# Patient Record
Sex: Male | Born: 1965
Health system: Southern US, Community
[De-identification: ages and names within clinical notes are randomized; demographics above are authoritative.]

---

## 2016-06-19 ENCOUNTER — Encounter (HOSPITAL_COMMUNITY): Payer: Self-pay | Admitting: Emergency Medicine

## 2016-06-19 ENCOUNTER — Emergency Department (HOSPITAL_COMMUNITY)
Admission: EM | Admit: 2016-06-19 | Discharge: 2016-06-19 | Disposition: A | Discharge status: Home / Self Care | Payer: BLUE CROSS/BLUE SHIELD | Attending: Emergency Medicine | Admitting: Emergency Medicine

## 2016-06-19 ENCOUNTER — Emergency Department (HOSPITAL_COMMUNITY): Payer: BLUE CROSS/BLUE SHIELD

## 2016-06-19 DIAGNOSIS — R002 Palpitations: Secondary | ICD-10-CM | POA: Insufficient documentation

## 2016-06-19 LAB — COMPREHENSIVE METABOLIC PANEL
ALBUMIN: 3.7 g/dL (ref 3.5–5.0)
ALK PHOS: 46 U/L (ref 38–126)
ALT: 16 U/L — ABNORMAL LOW (ref 17–63)
ANION GAP: 8 (ref 5–15)
AST: 19 U/L (ref 15–41)
BILIRUBIN TOTAL: 0.7 mg/dL (ref 0.3–1.2)
BUN: 21 mg/dL — AB (ref 6–20)
CALCIUM: 9.3 mg/dL (ref 8.9–10.3)
CO2: 24 mmol/L (ref 22–32)
Chloride: 106 mmol/L (ref 101–111)
Creatinine, Ser: 1.39 mg/dL — ABNORMAL HIGH (ref 0.61–1.24)
GFR calc Af Amer: >60 mL/min (ref >60)
GFR, EST NON AFRICAN AMERICAN: 57 mL/min — AB (ref >60)
GLUCOSE: 123 mg/dL — AB (ref 65–99)
Potassium: 3.9 mmol/L (ref 3.5–5.1)
Sodium: 138 mmol/L (ref 135–145)
TOTAL PROTEIN: 6.8 g/dL (ref 6.5–8.1)

## 2016-06-19 LAB — I-STAT TROPONIN, ED: TROPONIN I, POC: 0 ng/mL (ref 0.00–0.08)

## 2016-06-19 LAB — I-STAT CHEM 8, ED
BUN: 25 mg/dL — ABNORMAL HIGH (ref 6–20)
CHLORIDE: 105 mmol/L (ref 101–111)
CREATININE: 1.5 mg/dL — AB (ref 0.61–1.24)
Calcium, Ion: 1.1 mmol/L — ABNORMAL LOW (ref 1.15–1.40)
Glucose, Bld: 117 mg/dL — ABNORMAL HIGH (ref 65–99)
HEMATOCRIT: 45 % (ref 39.0–52.0)
HEMOGLOBIN: 15.3 g/dL (ref 13.0–17.0)
POTASSIUM: 3.8 mmol/L (ref 3.5–5.1)
Sodium: 139 mmol/L (ref 135–145)
TCO2: 24 mmol/L (ref 0–100)

## 2016-06-19 LAB — CBC
HEMATOCRIT: 42.1 % (ref 39.0–52.0)
HEMOGLOBIN: 13.5 g/dL (ref 13.0–17.0)
MCH: 26.4 pg (ref 26.0–34.0)
MCHC: 32.1 g/dL (ref 30.0–36.0)
MCV: 82.2 fL (ref 78.0–100.0)
Platelets: 211 10*3/uL (ref 150–400)
RBC: 5.12 MIL/uL (ref 4.22–5.81)
RDW: 14 % (ref 11.5–15.5)
WBC: 8.2 10*3/uL (ref 4.0–10.5)

## 2016-06-19 NOTE — ED Notes (Signed)
Pt states she took a supplement and drank coffee at 2130 yesterday evening. Pt states he felt as though his heart was racing.

## 2016-06-19 NOTE — ED Notes (Signed)
Patient transported to x-ray. ?

## 2016-06-19 NOTE — ED Triage Notes (Signed)
Patient states that he had taken some yohimhe bark and coffee together.  He states that was about 10pm.  He states that he had a feeling of his heart racing, but no chest pain.  No shortness of breath.  EMS came to see him, all vitals at that time were normal.  Patient states that he thought that he should come in.

## 2016-06-19 NOTE — ED Provider Notes (Signed)
[facility]-EMERGENCY DEPT Provider Note   CSN: [id number] Arrival date & time: 06/19/16  0200  By signing my name below, I, Donald Mccarty, attest that this documentation has been prepared under the direction and in the presence of Donald Rhine, MD. Electronically Signed: Rosario Mccarty, ED Scribe. 06/19/16. 3:41 AM.  History   Chief Complaint Chief Complaint  Patient presents with  . Tachycardia   The history is provided by the patient. No language interpreter was used.  Palpitations   This is a new problem. The current episode started 6 to 12 hours ago. The problem occurs rarely. The problem has been resolved. The problem is associated with caffeine (supplements). Pertinent negatives include no fever, no chest pain, no abdominal pain, no nausea, no vomiting, no weakness and no shortness of breath. He has tried nothing for the symptoms. Risk factors include being male. His past medical history does not include anemia, heart disease, hyperthyroidism or valve disorder.    HPI Comments: Donald Mccarty is a 51 y.o. male with no pertinent PMHx, who presents to the Emergency Department complaining of sudden onset, intermittent, resolved episodes of sensation of palpitations which occurred approximately 6 hours ago. Pt states that prior to the onset of his sensation that he had ingested a Yohimmbe bark supplement and coffee. He had ingested the supplement 30 minutes prior to the episodes and the cup of coffee before this. Prior to this episode he was asymptomatic and felt at his baseline. He called EMS following this episode, and upon their arrival he notes that the sensation had mostly subsided and his vital signs were stable. After EMS left, he notes that he had one more mild episode of this prompting him to come into the ED. Pt has taken this supplement in the past without similar symptoms. No illicit drug usage. Pt is a non-smoker. No h/o prior MIs or CVAs. He denies syncope, fever, nausea,  vomiting, chest pain, shortness of breath, abdominal pain, weakness, or any other associated symptoms.   PMH - none Soc hx - denies drug or etoh use Home Medications    Prior to Admission medications   Not on File   Family History History reviewed. No pertinent family history.  Social History Social History  Substance Use Topics  . Smoking status: Never Smoker  . Smokeless tobacco: Never Used  . Alcohol use No   Allergies   Patient has no known allergies.  Review of Systems Review of Systems  Constitutional: Negative for fever.  Respiratory: Negative for shortness of breath.   Cardiovascular: Positive for palpitations (resolved). Negative for chest pain.  Gastrointestinal: Negative for abdominal pain, nausea and vomiting.  Neurological: Negative for syncope and weakness.  All other systems reviewed and are negative.  Physical Exam Updated Vital Signs BP 140/93   Pulse 61   Temp 99.2 F (37.3 C) (Oral)   Resp 15   SpO2 99%   Physical Exam CONSTITUTIONAL: Well developed/well nourished HEAD: Normocephalic/atraumatic EYES: EOMI/PERRL ENMT: Mucous membranes moist NECK: supple no meningeal signs SPINE/BACK:entire spine nontender CV: S1/S2 noted, no murmurs/rubs/gallops noted LUNGS: Lungs are clear to auscultation bilaterally, no apparent distress ABDOMEN: soft, nontender, no rebound or guarding, bowel sounds noted throughout abdomen GU:no cva tenderness NEURO: Pt is awake/alert/appropriate, moves all extremitiesx4.  No facial droop.   EXTREMITIES: pulses normal/equal, full ROM SKIN: warm, color normal PSYCH: no abnormalities of mood noted, alert and oriented to situation  ED Treatments / Results  DIAGNOSTIC STUDIES: Oxygen Saturation is 99% on  RA, normal by my interpretation.   COORDINATION OF CARE: 3:40 AM-Discussed next steps with pt. Pt verbalized understanding and is agreeable with the plan.   Labs (all labs ordered are listed, but only abnormal results  are displayed) Labs Reviewed  COMPREHENSIVE METABOLIC PANEL - Abnormal; Notable for the following:       Result Value   Glucose, Bld 123 (*)    BUN 21 (*)    Creatinine, Ser 1.39 (*)    ALT 16 (*)    GFR calc non Af Amer 57 (*)    All other components within normal limits  I-STAT CHEM 8, ED - Abnormal; Notable for the following:    BUN 25 (*)    Creatinine, Ser 1.50 (*)    Glucose, Bld 117 (*)    Calcium, Ion 1.10 (*)    All other components within normal limits  CBC  I-STAT TROPOININ, ED   EKG  EKG Interpretation  Date/Time:  Sunday June 19 2016 02:10:23 EST Ventricular Rate:  65 PR Interval:  122 QRS Duration: 104 QT Interval:  374 QTC Calculation: 388 R Axis:   56 Text Interpretation:  Normal sinus rhythm Nonspecific T wave abnormality Abnormal ECG No previous ECGs available Confirmed by Bebe ShaggyWICKLINE  MD, Jaylnn Ullery ([id number]) on 06/19/2016 3:33:39 AM      Radiology Dg Chest 2 View  Result Date: 06/19/2016 CLINICAL DATA:  Tachycardia.  Nonsmoker. EXAM: CHEST  2 VIEW COMPARISON:  None. FINDINGS: The heart size and mediastinal contours are within normal limits. Both lungs are clear. The visualized skeletal structures are unremarkable. IMPRESSION: No active cardiopulmonary disease. Electronically Signed   By: Burman NievesWilliam  Stevens M.D.   On: 06/19/2016 02:56   Procedures Procedures   Medications Ordered in ED Medications - No data to display  Initial Impression / Assessment and Plan / ED Course  I have reviewed the triage vital signs and the nursing notes.  Pertinent labs & imaging results that were available during my care of the patient were reviewed by me and considered in my medical decision making (see chart for details).     Pt well appearing Pt with isolated palpitations likely due supplement and coffee Now at baseline No abnormal EKG findings Will d/c home We discussed strict ER return precautions I doubt ACS/PE or dysrhythmia at this time  Final Clinical  Impressions(s) / ED Diagnoses   Final diagnoses:  Palpitations   New Prescriptions New Prescriptions   No medications on file   I personally performed the services described in this documentation, which was scribed in my presence. The recorded information has been reviewed and is accurate.       Donald Rhineonald Brees Hounshell, MD 06/19/16 [phone]

## 2016-07-14 ENCOUNTER — Emergency Department (HOSPITAL_COMMUNITY): Payer: BLUE CROSS/BLUE SHIELD

## 2016-07-14 ENCOUNTER — Encounter (HOSPITAL_COMMUNITY): Payer: Self-pay | Admitting: Emergency Medicine

## 2016-07-14 DIAGNOSIS — R0789 Other chest pain: Secondary | ICD-10-CM | POA: Insufficient documentation

## 2016-07-14 DIAGNOSIS — R079 Chest pain, unspecified: Secondary | ICD-10-CM | POA: Diagnosis present

## 2016-07-14 LAB — BASIC METABOLIC PANEL
ANION GAP: 13 (ref 5–15)
BUN: 14 mg/dL (ref 6–20)
CHLORIDE: 94 mmol/L — AB (ref 101–111)
CO2: 27 mmol/L (ref 22–32)
Calcium: 9.5 mg/dL (ref 8.9–10.3)
Creatinine, Ser: 1.29 mg/dL — ABNORMAL HIGH (ref 0.61–1.24)
GFR calc non Af Amer: >60 mL/min (ref >60)
Glucose, Bld: 94 mg/dL (ref 65–99)
POTASSIUM: 3.4 mmol/L — AB (ref 3.5–5.1)
SODIUM: 134 mmol/L — AB (ref 135–145)

## 2016-07-14 LAB — I-STAT TROPONIN, ED: Troponin i, poc: 0.01 ng/mL (ref 0.00–0.08)

## 2016-07-14 LAB — CBC
HEMATOCRIT: 39.1 % (ref 39.0–52.0)
HEMOGLOBIN: 12.6 g/dL — AB (ref 13.0–17.0)
MCH: 26.2 pg (ref 26.0–34.0)
MCHC: 32.2 g/dL (ref 30.0–36.0)
MCV: 81.3 fL (ref 78.0–100.0)
PLATELETS: 219 10*3/uL (ref 150–400)
RBC: 4.81 MIL/uL (ref 4.22–5.81)
RDW: 13.8 % (ref 11.5–15.5)
WBC: 6.3 10*3/uL (ref 4.0–10.5)

## 2016-07-14 NOTE — ED Triage Notes (Signed)
Pt c/o left side chest pressure and dizziness started 1 week ago getting worse today, denies any fever, chills, nausea or vomiting.

## 2016-07-15 ENCOUNTER — Emergency Department (HOSPITAL_COMMUNITY)
Admission: EM | Admit: 2016-07-15 | Discharge: 2016-07-15 | Disposition: A | Discharge status: Home / Self Care | Payer: BLUE CROSS/BLUE SHIELD | Attending: Emergency Medicine | Admitting: Emergency Medicine

## 2016-07-15 DIAGNOSIS — R0789 Other chest pain: Secondary | ICD-10-CM

## 2016-07-15 LAB — I-STAT TROPONIN, ED: TROPONIN I, POC: 0.01 ng/mL (ref 0.00–0.08)

## 2016-07-15 NOTE — ED Provider Notes (Signed)
TIME SEEN: 1:35 AM By signing my name below, I, Donald Mccarty, attest that this documentation has been prepared under the direction and in the presence of Enbridge EnergyKristen N Gaberiel Youngblood, DO.  Electronically Signed: Octavia HeirArianna Mccarty, ED Scribe. 07/15/16. 1:51 AM.  CHIEF COMPLAINT:  Chief Complaint  Patient presents with  . Chest Pain  . Dizziness    HPI:  HPI Comments: Donald Mccarty is a 51 y.o. male who presents to the Emergency Department complaining of moderate, progressively worsening, intermittent, left sided chest pain x 1 week. He states it will have a "warm" sensation that will occasionally "pinch or grab" that feels like a pulled muscle. Pt says his pain will last for a few seconds and will appear randomly. Pt reports associated belching. He says that today he began to feel light-headed if he was going to lose consciousness but he is unsure as to why. Pt expresses he was standing up when he began to feel light-headed at work.  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 He states having URI symptoms Including productive cough and "runny nose" last week but is unsure if that could be a potential cause for his current symptoms. Pt has a brother and mother who both suffered from MI's but is unsure how old they were when they had their first episode. No history of PE, DVT, exogenous estrogen use, recent fractures, surgery, trauma, hospitalization or prolonged travel. No lower extremity swelling or pain. No calf tenderness.  He denies history of hypertension, diabetes or hyperlipidemia. No history of CAD. Pt further denies shortness of breath, nausea, vomiting, sweating. Pt is a non smoker.   ROS: See HPI Constitutional: no fever  Eyes: no drainage  ENT: no runny nose   Cardiovascular:  (+) chest pain  Resp: no SOB  GI: no vomiting GU: no dysuria Integumentary: no rash  Allergy: no hives  Musculoskeletal: no leg swelling  Neurological: no slurred  speech ROS otherwise negative  PAST MEDICAL HISTORY/PAST SURGICAL HISTORY:  History reviewed. No pertinent past medical history.  MEDICATIONS:  Prior to Admission medications   Not on File    ALLERGIES:  No Known Allergies  SOCIAL HISTORY:  Social History  Substance Use Topics  . Smoking status: Never Smoker  . Smokeless tobacco: Never Used  . Alcohol use No    FAMILY HISTORY: History reviewed. No pertinent family history.  EXAM: BP (!) 145/81 (BP Location: Right Arm)   Pulse (!) 51   Temp 98.2 F (36.8 C) (Oral)   Resp 19   Ht 6' (1.829 m)   Wt 175 lb (79.4 kg)   SpO2 100%   BMI 23.73 kg/m  CONSTITUTIONAL: Alert and oriented and responds appropriately to questions. Well-appearing; well-nourished HEAD: Normocephalic EYES: Conjunctivae clear, pupils appear equal, EOMI ENT: normal nose; moist mucous membranes NECK: Supple, no meningismus, no nuchal rigidity, no LAD  CARD: RRR; S1 and S2 appreciated; no murmurs, no clicks, no rubs, no gallops CHEST:  Chest wall is nontender to palpation.  No crepitus, ecchymosis, erythema, warmth, rash or other lesions present.   RESP: Normal chest excursion without splinting or tachypnea; breath sounds clear and equal bilaterally; no wheezes, no rhonchi, no rales, no hypoxia or respiratory distress, speaking full sentences ABD/GI: Normal bowel sounds; non-distended; soft, non-tender, no rebound, no guarding, no peritoneal signs, no hepatosplenomegaly BACK:  The back appears normal and is non-tender to palpation, there is no CVA tenderness EXT: Normal ROM in  all joints; non-tender to palpation; no edema; normal capillary refill; no cyanosis, no calf tenderness or swelling    SKIN: Normal color for age and race; warm; no rash NEURO: Moves all extremities equally PSYCH: The patient's mood and manner are appropriate. Grooming and personal hygiene are appropriate.  MEDICAL DECISION MAKING: Patient here with very atypical chest pain. The  pain lasts for only several seconds and then resolves. States it is worse with pushing, pulling which he does a lot of that work and thinks he may have injured his chest. I suspect this is musculoskeletal pain. He is currently asymptomatic. First troponin is negative. He does have mild elevation of his creatinine which appears to be baseline. Chest x-ray is clear. Plan is to repeat second troponin and if negative discharge home with outpatient follow-up. No risk factors for pulmonary embolus. Doubt dissection.   ED PROGRESS: Patient's second troponin is negative. He is still hemodynamically stable and asymptomatic. He states he does have a primary care physician appointment scheduled on April 12. Have encouraged him to go to this appointment. Discussed with him return precautions. I recommended that he has pain at home he can take Tylenol. He verbalizes understanding and is comfortable with this plan.   At this time, I do not feel there is any life-threatening condition present. I have reviewed and discussed all results (EKG, imaging, lab, urine as appropriate) and exam findings with patient/family. I have reviewed nursing notes and appropriate previous records.  I feel the patient is safe to be discharged home without further emergent workup and can continue workup as an outpatient as needed. Discussed usual and customary return precautions. Patient/family verbalize understanding and are comfortable with this plan.  Outpatient follow-up has been provided if needed. All questions have been answered.     EKG Interpretation  Date/Time:  Thursday July 14 2016 20:19:41 EDT Ventricular Rate:  72 PR Interval:  120 QRS Duration: 108 QT Interval:  388 QTC Calculation: 424 R Axis:   80 Text Interpretation:  Normal sinus rhythm Incomplete right bundle branch block Nonspecific T wave abnormality Abnormal ECG No significant change since last tracing Confirmed by Deni Berti,  DO, Hattie Aguinaldo ([id number]) on 07/15/2016  1:35:27 AM        I personally performed the services described in this documentation, which was scribed in my presence. The recorded information has been reviewed and is accurate.    Layla Maw Zeidy Tayag, DO 07/15/16 [phone]

## 2016-07-15 NOTE — Discharge Instructions (Signed)
You may take Tylenol 1000 mg every 6 hours as needed for pain.   Please follow-up with your primary care physician as scheduled on April 12.

## 2016-08-19 ENCOUNTER — Encounter (HOSPITAL_COMMUNITY): Payer: Self-pay | Admitting: Oncology

## 2016-08-19 ENCOUNTER — Emergency Department (HOSPITAL_COMMUNITY)
Admission: EM | Admit: 2016-08-19 | Discharge: 2016-08-20 | Disposition: A | Discharge status: Home / Self Care | Payer: BLUE CROSS/BLUE SHIELD | Attending: Emergency Medicine | Admitting: Emergency Medicine

## 2016-08-19 DIAGNOSIS — R350 Frequency of micturition: Secondary | ICD-10-CM | POA: Diagnosis not present

## 2016-08-19 LAB — URINALYSIS, ROUTINE W REFLEX MICROSCOPIC
BILIRUBIN URINE: NEGATIVE
Glucose, UA: NEGATIVE mg/dL
HGB URINE DIPSTICK: NEGATIVE
Ketones, ur: NEGATIVE mg/dL
Leukocytes, UA: NEGATIVE
Nitrite: NEGATIVE
PH: 5 (ref 5.0–8.0)
Protein, ur: NEGATIVE mg/dL
SPECIFIC GRAVITY, URINE: 1.002 — AB (ref 1.005–1.030)

## 2016-08-19 NOTE — ED Triage Notes (Signed)
Per pt he was dx by his PCP w/ a UTI on 08/11/16.  Pt given cipro, last dose will be taken on Sunday.  Per pt he has had no improvement in sx.  Reports urinary frequency during the day.  States that he can sleep through the night, in the morning he has no sx however as the day goes on he finds himself needing to urinate approximately every 15 minutes.  Denies burning w/ urination or discharge from penis.

## 2016-08-20 LAB — BASIC METABOLIC PANEL
ANION GAP: 8 (ref 5–15)
BUN: 22 mg/dL — AB (ref 6–20)
CHLORIDE: 101 mmol/L (ref 101–111)
CO2: 27 mmol/L (ref 22–32)
Calcium: 9.6 mg/dL (ref 8.9–10.3)
Creatinine, Ser: 1.05 mg/dL (ref 0.61–1.24)
GFR calc Af Amer: >60 mL/min (ref >60)
GLUCOSE: 85 mg/dL (ref 65–99)
POTASSIUM: 4.1 mmol/L (ref 3.5–5.1)
SODIUM: 136 mmol/L (ref 135–145)

## 2016-08-20 LAB — CBC WITH DIFFERENTIAL/PLATELET
BASOS ABS: 0 10*3/uL (ref 0.0–0.1)
Basophils Relative: 1 %
Eosinophils Absolute: 0.1 10*3/uL (ref 0.0–0.7)
Eosinophils Relative: 2 %
HCT: 40 % (ref 39.0–52.0)
HEMOGLOBIN: 13.1 g/dL (ref 13.0–17.0)
LYMPHS ABS: 1.9 10*3/uL (ref 0.7–4.0)
LYMPHS PCT: 27 %
MCH: 26.4 pg (ref 26.0–34.0)
MCHC: 32.8 g/dL (ref 30.0–36.0)
MCV: 80.5 fL (ref 78.0–100.0)
Monocytes Absolute: 0.4 10*3/uL (ref 0.1–1.0)
Monocytes Relative: 6 %
NEUTROS PCT: 64 %
Neutro Abs: 4.5 10*3/uL (ref 1.7–7.7)
Platelets: 251 10*3/uL (ref 150–400)
RBC: 4.97 MIL/uL (ref 4.22–5.81)
RDW: 15.1 % (ref 11.5–15.5)
WBC: 7 10*3/uL (ref 4.0–10.5)

## 2016-08-20 MED ORDER — DOXYCYCLINE HYCLATE 100 MG PO CAPS: 100.0000 mg | ORAL_CAPSULE | Freq: Two times a day (BID) | ORAL | 0 refills | Status: AC

## 2016-08-20 NOTE — ED Provider Notes (Signed)
[facility]-EMERGENCY DEPT Provider Note   CSN: [id number] Arrival date & time: 08/19/16  2120     History   Chief Complaint Chief Complaint  Patient presents with  . Urinary Frequency    HPI Donald Mccarty is a 51 y.o. male.  The history is provided by the patient. No language interpreter was used.  Urinary Frequency  This is a new problem. Episode onset: 2 months ago. The problem occurs constantly. The problem has been gradually worsening. Nothing aggravates the symptoms. Nothing relieves the symptoms. He has tried nothing for the symptoms. The treatment provided no relief.   Pt reports increasing urinary frequency.  Pt reports worse during the day.  Pt had prostate exam and has been on cipro for over a week with no relief History reviewed. No pertinent past medical history.  There are no active problems to display for this patient.   History reviewed. No pertinent surgical history.     Home Medications    Prior to Admission medications   Not on File    Family History No family history on file.  Social History Social History  Substance Use Topics  . Smoking status: Never Smoker  . Smokeless tobacco: Never Used  . Alcohol use No     Allergies   Patient has no known allergies.   Review of Systems Review of Systems  Genitourinary: Positive for frequency.  All other systems reviewed and are negative.    Physical Exam Updated Vital Signs BP (!) 151/93 (BP Location: Left Arm)   Pulse 86   Temp 98.8 F (37.1 C) (Oral)   Resp 16   Ht  (1.778 m)   Wt 71.7 kg   SpO2 100%   BMI 22.67 kg/m   Physical Exam  Constitutional: He appears well-developed and well-nourished.  HENT:  Head: Normocephalic and atraumatic.  Eyes: Conjunctivae are normal.  Neck: Neck supple.  Cardiovascular: Normal rate and regular rhythm.   No murmur heard. Pulmonary/Chest: Effort normal and breath sounds normal. No respiratory distress.  Abdominal: Soft. There is no  tenderness.  Musculoskeletal: He exhibits no edema.  Neurological: He is alert.  Skin: Skin is warm and dry.  Psychiatric: He has a normal mood and affect.  Nursing note and vitals reviewed.    ED Treatments / Results  Labs (all labs ordered are listed, but only abnormal results are displayed) Labs Reviewed  URINALYSIS, ROUTINE W REFLEX MICROSCOPIC - Abnormal; Notable for the following:       Result Value   Color, Urine COLORLESS (*)    Specific Gravity, Urine 1.002 (*)    All other components within normal limits  CBC WITH DIFFERENTIAL/PLATELET  BASIC METABOLIC PANEL    EKG  EKG Interpretation None       Radiology No results found.  Procedures Procedures (including critical care time)  Medications Ordered in ED Medications - No data to display   Initial Impression / Assessment and Plan / ED Course  I have reviewed the triage vital signs and the nursing notes.  Pertinent labs & imaging results that were available during my care of the patient were reviewed by me and considered in my medical decision making (see chart for details).       Final Clinical Impressions(s) / ED Diagnoses   Final diagnoses:  Urinary frequency   Pt advised to schedule to see Urology.  Urine is negative,  Labs are normal.   New Prescriptions New Prescriptions   DOXYCYCLINE (VIBRAMYCIN) 100 MG CAPSULE  Take 1 capsule (100 mg total) by mouth 2 (two) times daily.  An After Visit Summary was printed and given to the patient.    Lonia Skinner Thibodaux, PA-C 08/20/16 0109    Charlynne Pander, MD 08/20/16 [phone]

## 2018-04-25 IMAGING — DX DG CHEST 2V
2 series · 2 of 2 positions shown · non-contrast
Comparison: Chest radiograph dated 06/19/2016

CLINICAL DATA: 50-year-old male with chest pain and
lightheadedness.

EXAM:
CHEST  2 VIEW

[chest pa]
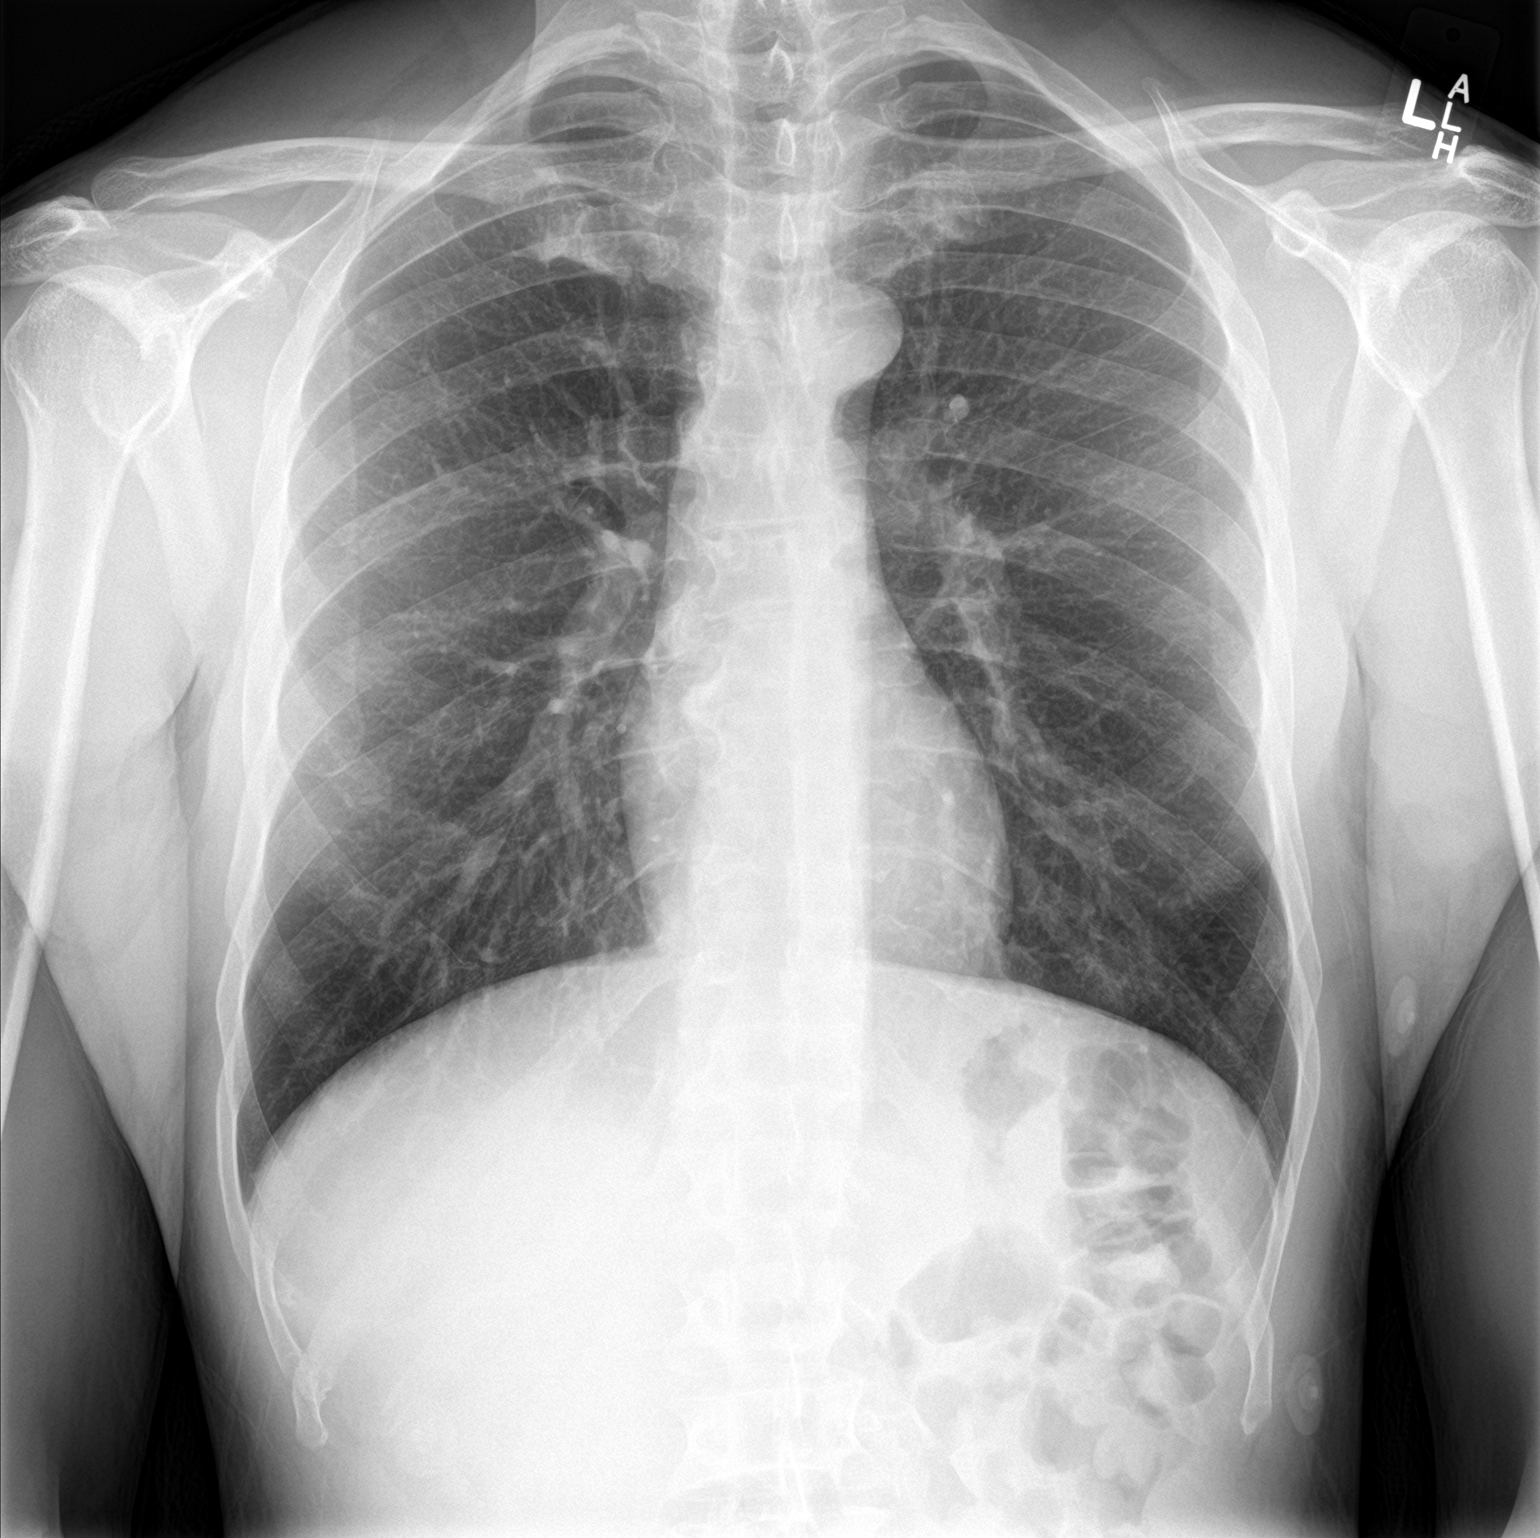

[chest lat]
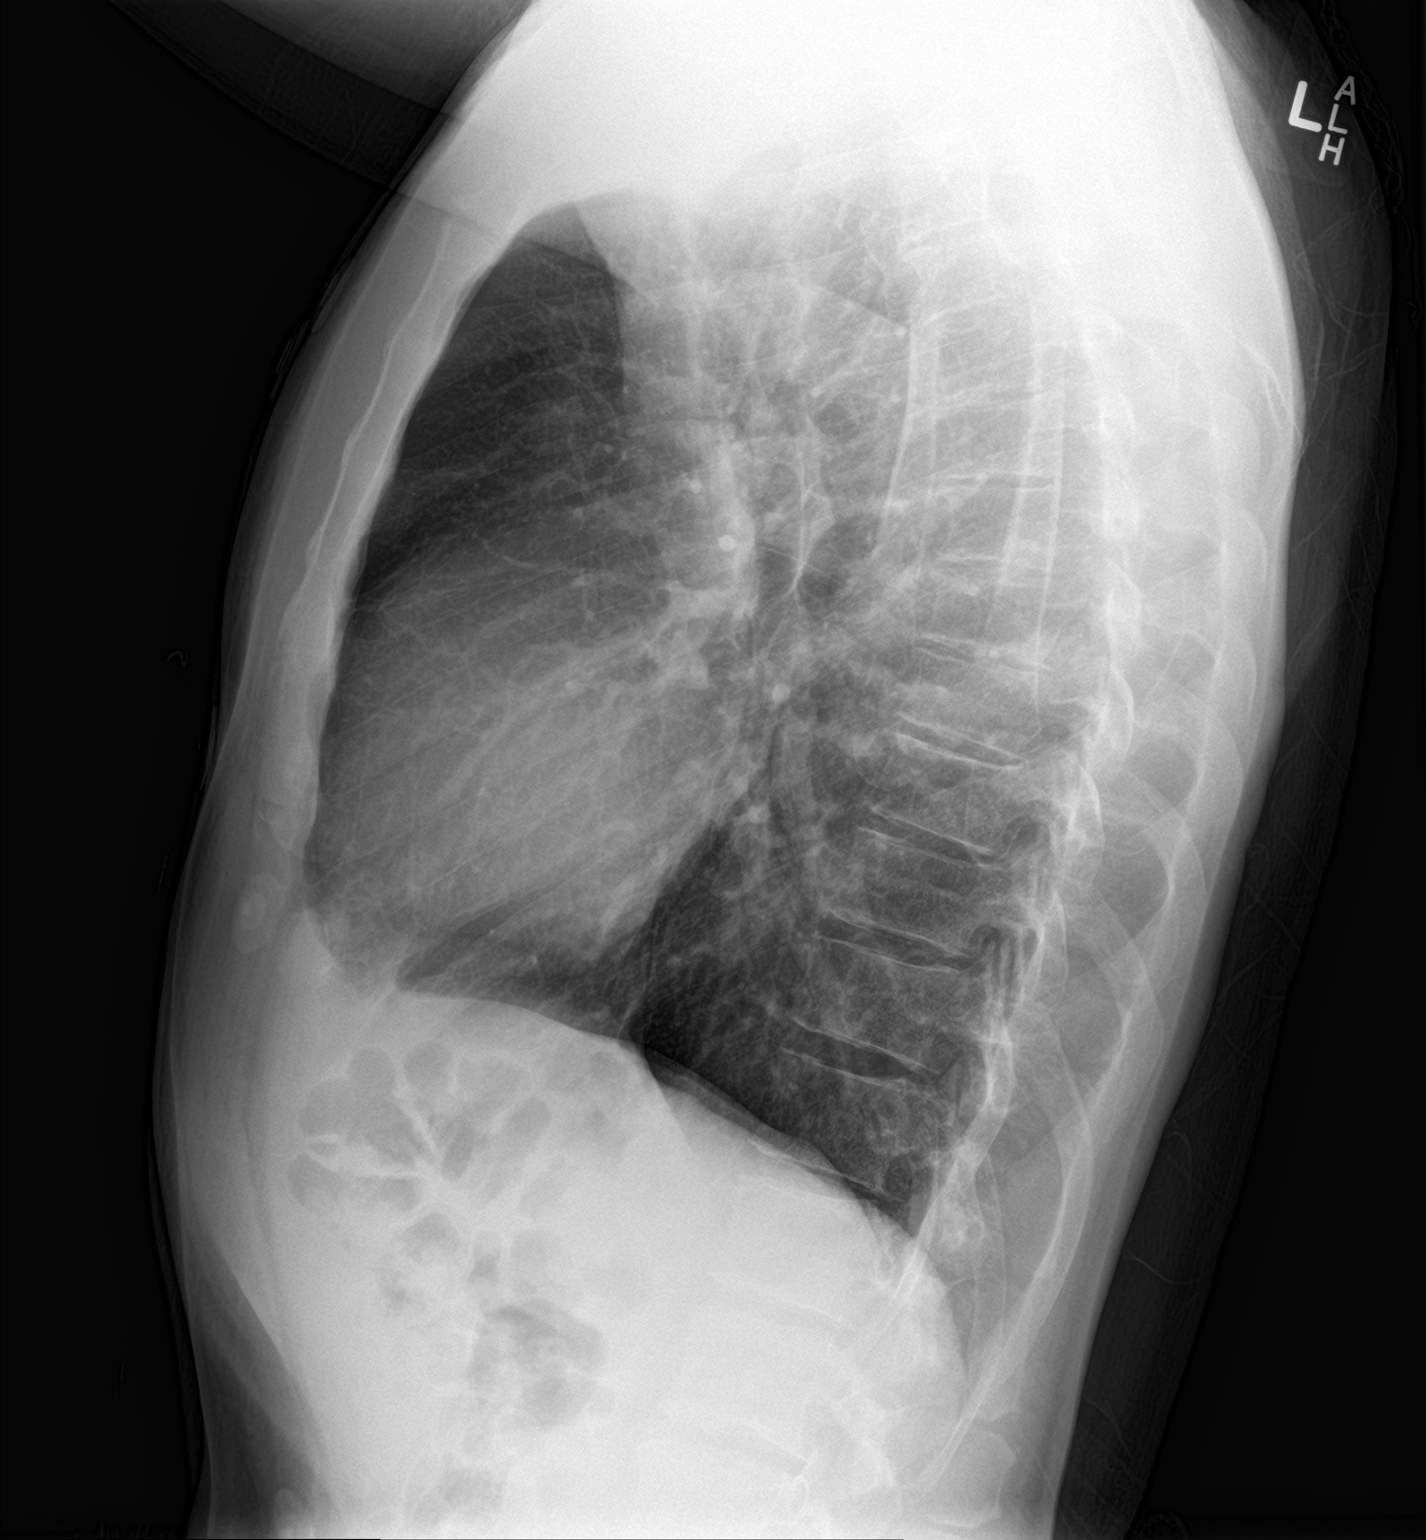

[2 of 2 positions shown; findings below may reference images not displayed]

FINDINGS: The heart size and mediastinal contours are within normal limits.
Both lungs are clear. The visualized skeletal structures are
unremarkable.
IMPRESSION: No active cardiopulmonary disease.

## 2020-12-29 DIAGNOSIS — Z125 Encounter for screening for malignant neoplasm of prostate: Secondary | ICD-10-CM | POA: Diagnosis not present

## 2020-12-29 DIAGNOSIS — Z13220 Encounter for screening for lipoid disorders: Secondary | ICD-10-CM | POA: Diagnosis not present

## 2020-12-29 DIAGNOSIS — Z0000 Encounter for general adult medical examination without abnormal findings: Secondary | ICD-10-CM | POA: Diagnosis not present

## 2020-12-29 DIAGNOSIS — Z131 Encounter for screening for diabetes mellitus: Secondary | ICD-10-CM | POA: Diagnosis not present

## 2020-12-29 DIAGNOSIS — Z7189 Other specified counseling: Secondary | ICD-10-CM | POA: Diagnosis not present

## 2020-12-29 DIAGNOSIS — E559 Vitamin D deficiency, unspecified: Secondary | ICD-10-CM | POA: Diagnosis not present

## 2020-12-29 DIAGNOSIS — H53142 Visual discomfort, left eye: Secondary | ICD-10-CM | POA: Diagnosis not present

## 2021-01-06 DIAGNOSIS — G453 Amaurosis fugax: Secondary | ICD-10-CM | POA: Diagnosis not present

## 2021-01-18 ENCOUNTER — Encounter (HOSPITAL_COMMUNITY): Payer: Self-pay | Admitting: Cardiology

## 2021-01-18 DIAGNOSIS — G453 Amaurosis fugax: Secondary | ICD-10-CM | POA: Diagnosis not present

## 2021-01-18 DIAGNOSIS — E7849 Other hyperlipidemia: Secondary | ICD-10-CM | POA: Diagnosis not present

## 2021-01-19 ENCOUNTER — Other Ambulatory Visit (HOSPITAL_COMMUNITY): Payer: Self-pay | Admitting: Internal Medicine

## 2021-01-19 DIAGNOSIS — G453 Amaurosis fugax: Secondary | ICD-10-CM

## 2021-01-21 ENCOUNTER — Other Ambulatory Visit (HOSPITAL_COMMUNITY): Payer: Self-pay | Admitting: Internal Medicine

## 2021-01-21 DIAGNOSIS — G453 Amaurosis fugax: Secondary | ICD-10-CM

## 2021-01-22 ENCOUNTER — Other Ambulatory Visit: Payer: Self-pay

## 2021-01-22 ENCOUNTER — Ambulatory Visit (HOSPITAL_COMMUNITY)
Admission: RE | Admit: 2021-01-22 | Discharge: 2021-01-22 | Disposition: A | Discharge status: Home / Self Care | Payer: BLUE CROSS/BLUE SHIELD | Source: Ambulatory Visit | Attending: Internal Medicine | Admitting: Internal Medicine

## 2021-01-22 DIAGNOSIS — G453 Amaurosis fugax: Secondary | ICD-10-CM

## 2021-02-05 ENCOUNTER — Ambulatory Visit (HOSPITAL_COMMUNITY)
Admission: RE | Admit: 2021-02-05 | Discharge: 2021-02-05 | Disposition: A | Discharge status: Home / Self Care | Payer: Self-pay | Source: Ambulatory Visit | Attending: Internal Medicine | Admitting: Internal Medicine

## 2021-02-05 ENCOUNTER — Other Ambulatory Visit: Payer: Self-pay

## 2021-02-05 DIAGNOSIS — I517 Cardiomegaly: Secondary | ICD-10-CM | POA: Insufficient documentation

## 2021-02-05 DIAGNOSIS — G453 Amaurosis fugax: Secondary | ICD-10-CM | POA: Insufficient documentation

## 2021-02-05 LAB — ECHOCARDIOGRAM COMPLETE
Area-P 1/2: 3.37 cm2
Calc EF: 54.4 %
S' Lateral: 2.7 cm
Single Plane A2C EF: 57.1 %
Single Plane A4C EF: 53.8 %

## 2021-02-05 NOTE — Progress Notes (Signed)
  Echocardiogram 2D Echocardiogram has been performed.  Sheralyn Boatman R 02/05/2021, 1:50 PM

## 2021-02-23 DIAGNOSIS — H53142 Visual discomfort, left eye: Secondary | ICD-10-CM | POA: Diagnosis not present

## 2021-02-23 DIAGNOSIS — E7849 Other hyperlipidemia: Secondary | ICD-10-CM | POA: Diagnosis not present

## 2023-12-09 ENCOUNTER — Emergency Department (HOSPITAL_COMMUNITY)
Admission: EM | Admit: 2023-12-09 | Discharge: 2023-12-09 | Disposition: A | Discharge status: Home / Self Care | Payer: Self-pay | Attending: Emergency Medicine | Admitting: Emergency Medicine

## 2023-12-09 ENCOUNTER — Encounter (HOSPITAL_COMMUNITY): Payer: Self-pay | Admitting: *Deleted

## 2023-12-09 ENCOUNTER — Other Ambulatory Visit: Payer: Self-pay

## 2023-12-09 DIAGNOSIS — R0989 Other specified symptoms and signs involving the circulatory and respiratory systems: Secondary | ICD-10-CM | POA: Insufficient documentation

## 2023-12-09 MED ORDER — OMEPRAZOLE 20 MG PO CPDR: 20.0000 mg | DELAYED_RELEASE_CAPSULE | Freq: Every day | ORAL | 0 refills | Status: AC

## 2023-12-09 MED ORDER — OMEPRAZOLE 20 MG PO CPDR: 20.0000 mg | DELAYED_RELEASE_CAPSULE | Freq: Every day | ORAL | 0 refills | Status: DC

## 2023-12-09 NOTE — ED Notes (Signed)
 PT D/C'D AFTER INSTRUCTIONS REVIEWED. PT VERBALIZED UNDERSTANDING. NAD REPORTED OR NOTED AT THIS TIME.

## 2023-12-09 NOTE — ED Notes (Addendum)
 Pt called in waiting room;did not answer

## 2023-12-09 NOTE — ED Provider Notes (Signed)
 [facility]-EMERGENCY DEPT [facility] Emergency Department Provider Note MRN:  [id number]  Arrival date & time: 12/09/23     Chief Complaint   throat pain   History of Present Illness   Donald Mccarty is a 58 y.o. year-old male presents to the ED with chief complaint of throat tightness.  First noticed it about a week ago.  States that he has been grieving and states that prior to grieving his loss, he hadn't had any of these symptoms.  States that his throat feels dry.  States that he has eliminated a mouth wash that he thought was causing his symptoms.  States that it feels like his throat spasms.  States that it comes on randomly. Denies any burning, SOB, chest tightness.  Denies any other associated symptoms.  Denies any trauma.  States that he has been able to eat and drink fine.  States that he feels fine now.  History provided by patient.   Review of Systems  Pertinent positive and negative review of systems noted in HPI.    Physical Exam   Vitals:   12/09/23 0011 12/09/23 0357  BP: 138/84 133/71  Pulse: 66 (!) 54  Resp: 16 16  Temp: 98.4 F (36.9 C) 98.1 F (36.7 C)  SpO2: 99% 100%    CONSTITUTIONAL:  non toxic-appearing, NAD NEURO:  Alert and oriented x 3, CN 3-12 grossly intact, normal voice EYES:  eyes equal and reactive ENT/NECK:  Supple, no stridor, tolerating secretions, normal appearing oropharynx, no exudates or abscess, no obvious neck mass or lymphadenopathy CARDIO:  normal rate, regular rhythm, appears well-perfused  PULM:  No respiratory distress, CTAB GI/GU:  non-distended,  MSK/SPINE:  No gross deformities, no edema, moves all extremities  SKIN:  no rash, atraumatic   *Additional and/or pertinent findings included in MDM below  Diagnostic and Interventional Summary    EKG Interpretation Date/Time:    Ventricular Rate:    PR Interval:    QRS Duration:    QT Interval:    QTC Calculation:   R Axis:      Text Interpretation:         Labs  Reviewed - No data to display  No orders to display    Medications - No data to display   Procedures  /  Critical Care Procedures  ED Course and Medical Decision Making  I have reviewed the triage vital signs, the nursing notes, and pertinent available records from the EMR.  Social Determinants Affecting Complexity of Care: Patient has no clinically significant social determinants affecting this chief complaint..   ED Course:    Medical Decision Making Patient here with intermittent episodes of throat tightness.  He denies having any chest pain or shortness of breath.  States that he is in the grieving process and sometimes feels like his throat is tightening or spasming.  He states that he has been having the symptoms intermittently for about a week.  He denies any fevers or chills.  He is able to eat and drink normally.  He has normal voice on my exam.  He is not having any chest pain or shortness of breath, doubt ACS.  There is no rash, no wheezing, no nausea or vomiting.  Doubt allergic reaction.  We discussed trialing a PPI, but also discussed that this could be anxiety/stress reaction due to patient going through the grieving process.  I also discussed obtaining advanced imaging to rule out mass or other acute pathology.  Patient declined this tonight,  and states that he would like to try a PPI.  I will have him follow-up with ENT.  Return precautions discussed.         Consultants: No consultations were needed in caring for this patient.   Treatment and Plan: Emergency department workup does not suggest an emergent condition requiring admission or immediate intervention beyond  what has been performed at this time. The patient is safe for discharge and has  been instructed to return immediately for worsening symptoms, change in  symptoms or any other concerns    Final Clinical Impressions(s) / ED Diagnoses     ICD-10-CM   1. Throat tightness  R09.89       ED  Discharge Orders          Ordered    omeprazole  (PRILOSEC) 20 MG capsule  Daily,   Status:  Discontinued        12/09/23 0456    omeprazole  (PRILOSEC) 20 MG capsule  Daily        12/09/23 0457              Discharge Instructions Discussed with and Provided to Patient:   Discharge Instructions   None      Vicky Charleston, PA-C 12/09/23 0457    Theadore Ozell HERO, MD 12/09/23 [phone]

## 2023-12-09 NOTE — ED Triage Notes (Signed)
 The pt has  episodes of feeling like his throat is closing up on him for the past week  he does not have sob or chest pain.    The pt reports that he is in the grieving process for one week but the feeling of his throat  closing is getting more often   the feeling is not there
